# Patient Record
Sex: Male | Born: 1980 | Hispanic: No | Marital: Single | Smoking: Never smoker
Health system: Southern US, Community
[De-identification: ages and names within clinical notes are randomized; demographics above are authoritative.]

---
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
## 2013-07-07 ENCOUNTER — Emergency Department (HOSPITAL_COMMUNITY)
Admission: EM | Admit: 2013-07-07 | Discharge: 2013-07-07 | Disposition: A | Discharge status: Home / Self Care | Payer: Self-pay | Attending: Emergency Medicine | Admitting: Emergency Medicine

## 2013-07-07 ENCOUNTER — Emergency Department (HOSPITAL_COMMUNITY): Payer: Self-pay

## 2013-07-07 DIAGNOSIS — J029 Acute pharyngitis, unspecified: Secondary | ICD-10-CM | POA: Insufficient documentation

## 2013-07-07 LAB — RAPID STREP SCREEN (MED CTR MEBANE ONLY): Streptococcus, Group A Screen (Direct): NEGATIVE

## 2013-07-07 NOTE — ED Provider Notes (Signed)
CSN: [id number]     Arrival date & time 07/07/13  2147 History     First MD Initiated Contact with Patient 07/07/13 2248     Chief Complaint  Patient presents with  . Spitting up blood    (Consider location/radiation/quality/duration/timing/severity/associated sxs/prior Treatment) HPI Comments: Patient reports he has had a sore throat for a few days, thought he had a summer cold.  He has been clearing his throat a lot and spits a lot at baseline due to smoking.  States this morning he cleared his throat and spit out blood.  States he can spit without clearing his throat and see some blood as well.  Denies fevers, chills, body aches, nasal congestion, postnasal drip, cough, abdominal pain, N/V.  Denies hemetemesis, denies that he is coughing up blood.  States this blood appears to be coming from his throat.  Denies bleeding from anywhere else or any abnormal bruising.   The history is provided by the patient.    No past medical history on file. No past surgical history on file. No family history on file. History  Substance Use Topics  . Smoking status: Not on file  . Smokeless tobacco: Not on file  . Alcohol Use: Not on file    Review of Systems  Constitutional: Negative for fever.  HENT: Positive for sore throat. Negative for nosebleeds, congestion, trouble swallowing, dental problem, voice change and sinus pressure.   Respiratory: Negative for cough and shortness of breath.   Cardiovascular: Negative for chest pain.  Gastrointestinal: Negative for nausea, vomiting, abdominal pain, diarrhea and blood in stool.  Genitourinary: Negative for hematuria.  Hematological: Does not bruise/bleed easily.    Allergies  Review of patient's allergies indicates no known allergies.  Home Medications  No current outpatient prescriptions on file. BP 130/54  Pulse 68  Temp(Src) 98.9 F (37.2 C) (Oral)  Resp 16  SpO2 98% Physical Exam  Nursing note and vitals reviewed. Constitutional: He  appears well-developed and well-nourished. No distress.  HENT:  Head: Normocephalic and atraumatic.  Nose: Nose normal.  Mouth/Throat: Uvula is midline. Mucous membranes are not dry. No edematous. Posterior oropharyngeal erythema present. No oropharyngeal exudate, posterior oropharyngeal edema or tonsillar abscesses.  Neck: Trachea normal, normal range of motion and phonation normal. Neck supple. No tracheal tenderness present. No tracheal deviation present. No mass present.  Cardiovascular: Normal rate and regular rhythm.   Pulmonary/Chest: Effort normal and breath sounds normal. No stridor.  Lymphadenopathy:    He has cervical adenopathy.  Neurological: He is alert.  Skin: He is not diaphoretic.    ED Course   Procedures (including critical care time)  Labs Reviewed  RAPID STREP SCREEN  CULTURE, GROUP A STREP   Dg Neck Soft Tissue  07/07/2013   *RADIOLOGY REPORT*  Clinical Data: Hemoptysis, sore throat  NECK SOFT TISSUES - 1+ VIEW  Comparison: None.  Findings: Normal prevertebral soft tissues and epiglottic shadow. No radiopaque foreign body.  Mild cervical degenerative changes C4- 5 and C5-6.  Normal alignment.  IMPRESSION: No acute finding.   Original Report Authenticated By: Judie Petit. Miles Costain, M.D.    Filed Vitals:   07/07/13 2300  BP: 122/60  Pulse: 56  Temp:   Resp:     1. Pharyngitis     MDM  Pt with sore throat x several days and excessive throat clearing.  Pt is a smoker.  Pharynx is erythematous and pt has mild anterior cervical lymphadenopathy.  He is well appearing, afebrile, nontoxic.  Likely blood  from irritation of throat given likely viral infection + smoking.  Lungs CTAB, no cough.  No hemoptysis.  No abnormal bleeding or bruising.  Discussed all results with patient.  Pt given return precautions.  Pt verbalizes understanding and agrees with plan.     I doubt any other EMC precluding discharge at this time including, but not necessarily limited to the following:  PE,  pneumonia  Trixie Dredge, PA-C 07/08/13 0018

## 2013-07-07 NOTE — ED Notes (Signed)
Pt. C/o sore throat x3 days, lymph nodes present. Pt. States today he started coughing up blood. Denies SOB, chest pain, abdominal pain, N/V.

## 2013-07-07 NOTE — ED Notes (Signed)
PA EBW preformed strep test.

## 2013-07-07 NOTE — ED Notes (Signed)
Pt states he has been spitting up clots of blood since 0530. Has a sore throat. Denies cough, fever, chills, body aches

## 2013-07-09 NOTE — ED Provider Notes (Signed)
Medical screening examination/treatment/procedure(s) were performed by non-physician practitioner and as supervising physician I was immediately available for consultation/collaboration.    Lanaiya Lantry J. Adonia Porada, MD 07/09/13 0720 

## 2013-07-10 LAB — CULTURE, GROUP A STREP

## 2014-02-24 IMAGING — CR DG NECK SOFT TISSUE
1 series · 1 of 1 positions shown · non-contrast
Comparison: None.

CLINICAL DATA: Hemoptysis, sore throat

NECK SOFT TISSUES - 1+ VIEW

[w soft tissue neck]
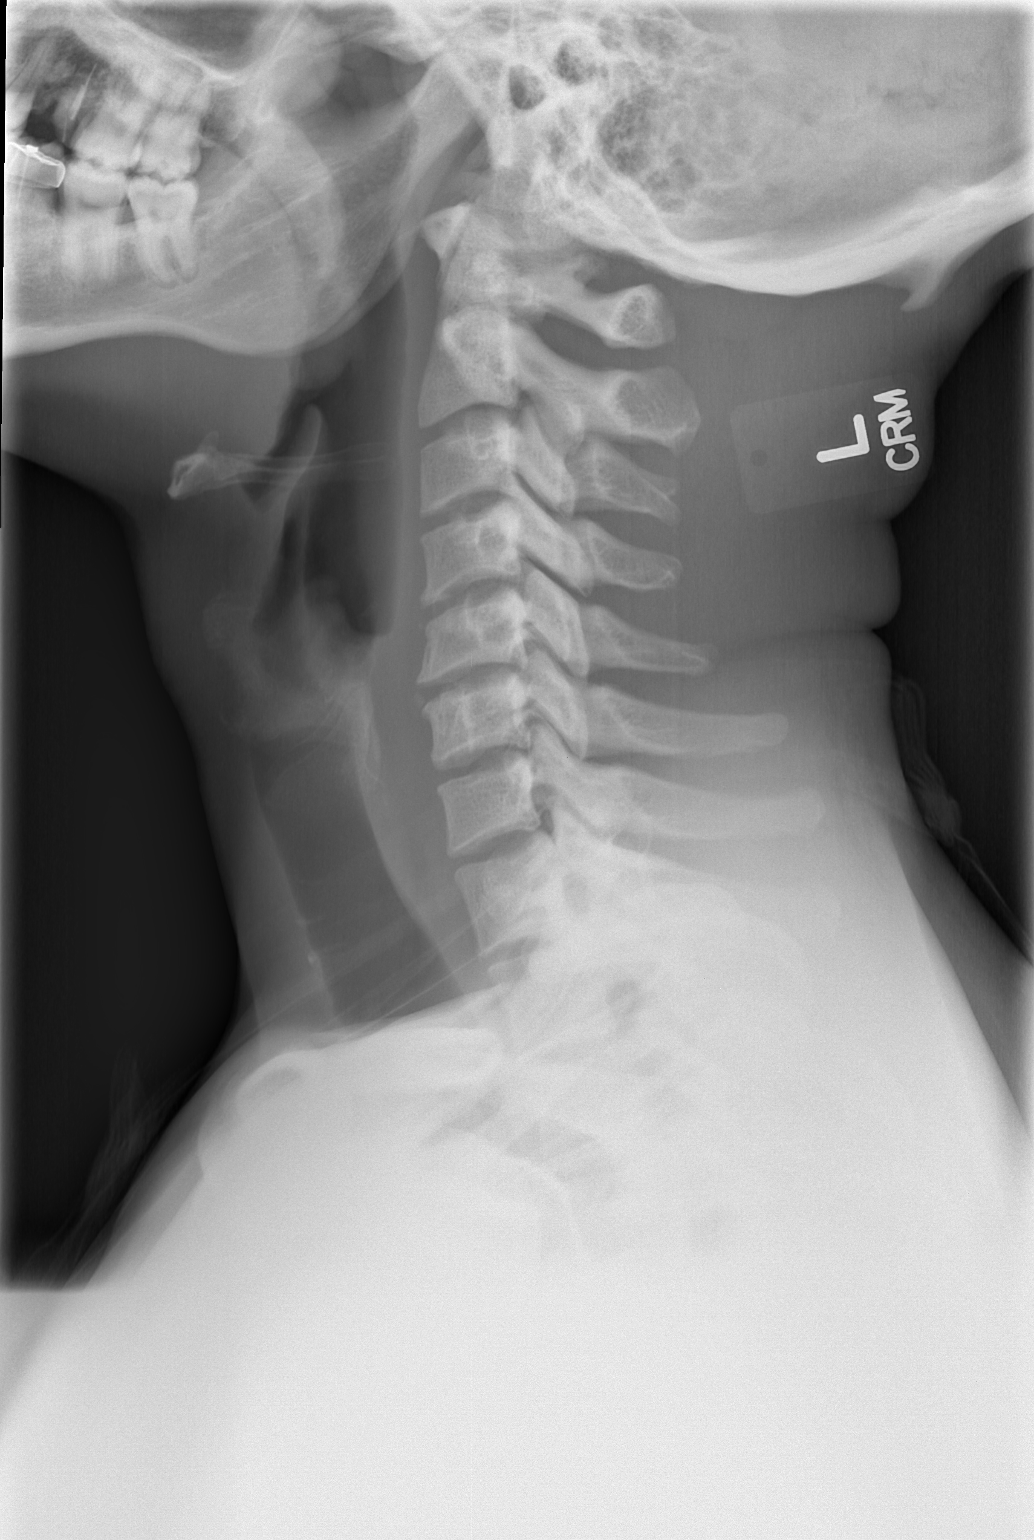

[1 of 1 positions shown; findings below may reference images not displayed]

FINDINGS: Normal prevertebral soft tissues and epiglottic shadow.
No radiopaque foreign body.  Mild cervical degenerative changes C4-
5 and C5-6.  Normal alignment.
IMPRESSION: No acute finding.

## 2016-01-27 ENCOUNTER — Emergency Department (INDEPENDENT_AMBULATORY_CARE_PROVIDER_SITE_OTHER)
Admission: EM | Admit: 2016-01-27 | Discharge: 2016-01-27 | Disposition: A | Discharge status: Home / Self Care | Payer: Self-pay | Source: Home / Self Care | Attending: Family Medicine | Admitting: Family Medicine

## 2016-01-27 ENCOUNTER — Encounter (HOSPITAL_COMMUNITY): Payer: Self-pay | Admitting: Emergency Medicine

## 2016-01-27 DIAGNOSIS — R51 Headache: Secondary | ICD-10-CM

## 2016-01-27 DIAGNOSIS — R519 Headache, unspecified: Secondary | ICD-10-CM

## 2016-01-27 LAB — POCT URINALYSIS DIP (DEVICE)
Glucose, UA: NEGATIVE mg/dL
LEUKOCYTES UA: NEGATIVE
Nitrite: NEGATIVE
Protein, ur: 30 mg/dL — AB
Urobilinogen, UA: 2 mg/dL — ABNORMAL HIGH (ref 0.0–1.0)
pH: 6 (ref 5.0–8.0)

## 2016-01-27 LAB — POCT I-STAT, CHEM 8
BUN: 12 mg/dL (ref 6–20)
CREATININE: 1.2 mg/dL (ref 0.61–1.24)
Calcium, Ion: 1.19 mmol/L (ref 1.12–1.23)
Chloride: 100 mmol/L — ABNORMAL LOW (ref 101–111)
GLUCOSE: 105 mg/dL — AB (ref 65–99)
HCT: 53 % — ABNORMAL HIGH (ref 39.0–52.0)
Hemoglobin: 18 g/dL — ABNORMAL HIGH (ref 13.0–17.0)
POTASSIUM: 4.2 mmol/L (ref 3.5–5.1)
Sodium: 142 mmol/L (ref 135–145)
TCO2: 30 mmol/L (ref 0–100)

## 2016-01-27 MED ORDER — NAPROXEN 500 MG PO TABS: 500.0000 mg | ORAL_TABLET | Freq: Two times a day (BID) | ORAL | Status: DC

## 2016-01-27 MED ORDER — KETOROLAC TROMETHAMINE 60 MG/2ML IM SOLN
60.0000 mg | Freq: Once | INTRAMUSCULAR | Status: AC
  Administered 2016-01-27: 60 mg via INTRAMUSCULAR

## 2016-01-27 MED ORDER — KETOROLAC TROMETHAMINE 60 MG/2ML IM SOLN
INTRAMUSCULAR | Status: AC
  Filled 2016-01-27: qty 2

## 2016-01-27 MED ORDER — ONDANSETRON HCL 4 MG PO TABS: 4.0000 mg | ORAL_TABLET | Freq: Four times a day (QID) | ORAL | Status: AC

## 2016-01-27 NOTE — ED Provider Notes (Signed)
CSN: [id number]     Arrival date & time 01/27/16  1314 History   First MD Initiated Contact with Patient 01/27/16 1424     Chief Complaint  Patient presents with  . Headache  . Generalized Body Aches   (Consider location/radiation/quality/duration/timing/severity/associated sxs/prior Treatment) HPI Headache and bilateral flank pain for 3 days. No improvement of either symptoms with OTC meds.  No dysuria, no headi njury. Pain score is 6.  Associated with some nausea, tried to drink [place] [employer] prior to arrival and vomited. No flu shot  flank pain started same day as headache History reviewed. No pertinent past medical history. History reviewed. No pertinent past surgical history. No family history on file. Social History  Substance Use Topics  . Smoking status: Current Every Day Smoker  . Smokeless tobacco: None  . Alcohol Use: Yes    Review of Systems Headache and flank pain Allergies  Review of patient's allergies indicates no known allergies.  Home Medications   Prior to Admission medications   Medication Sig Start Date End Date Taking? Authorizing Provider  Ascorbic Acid Buffered (BUFFERED C POWDER PO) Take by mouth.   Yes Historical Provider, MD  naproxen (NAPROSYN) 500 MG tablet Take 1 tablet (500 mg total) by mouth 2 (two) times daily. 01/27/16   Tharon Aquas, PA  ondansetron (ZOFRAN) 4 MG tablet Take 1 tablet (4 mg total) by mouth every 6 (six) hours. 01/27/16   Tharon Aquas, PA   Meds Ordered and Administered this Visit   Medications  ketorolac (TORADOL) injection 60 mg (60 mg Intramuscular Given 01/27/16 1514)    BP 128/64 mmHg  Pulse 61  Temp(Src) 99.1 F (37.3 C) (Oral)  Resp 16  SpO2 100% No data found.   Physical Exam NURSES NOTES AND VITAL SIGNS REVIEWED. CONSTITUTIONAL: Well developed, well nourished, no acute distress HEENT: normocephalic, atraumatic, right and left TM's are normal EYES: Conjunctiva normal NECK:normal ROM, supple, no  adenopathy PULMONARY:No respiratory distress, normal effort, Lungs: CTAb/l, no wheezes, or increased work of breathing CARDIOVASCULAR: RRR, no murmur ABDOMEN: soft, ND, NT, +'ve BS, without CVA -T MUSCULOSKELETAL: Normal ROM of all extremities,  SKIN: warm and dry without rash PSYCHIATRIC: Mood and affect, behavior are normal  ED Course  Procedures (including critical care time)  Labs Review Labs Reviewed  POCT URINALYSIS DIP (DEVICE) - Abnormal; Notable for the following:    Bilirubin Urine SMALL (*)    Ketones, ur TRACE (*)    Hgb urine dipstick SMALL (*)    Protein, ur 30 (*)    Urobilinogen, UA 2.0 (*)    All other components within normal limits  POCT I-STAT, CHEM 8 - Abnormal; Notable for the following:    Chloride 100 (*)    Glucose, Bld 105 (*)    Hemoglobin 18.0 (*)    HCT 53.0 (*)    All other components within normal limits    Imaging Review No results found.   Visual Acuity Review  Right Eye Distance:   Left Eye Distance:   Bilateral Distance:    Right Eye Near:   Left Eye Near:    Bilateral Near:        Reviewed bloods and UA with patient and his wife.  Toradol helped symptoms a great deal pain score 2 No indication for emergent transfer to ED.  MDM   1. Nonintractable headache, unspecified chronicity pattern, unspecified headache type    Patient is advised to continue home symptomatic treatment. Prescription for zofran and  naproxen sent pharmacy patient has indicated. Patient is advised that if there are new or worsening symptoms or attend the emergency department, or contact primary care provider. Instructions of care provided discharged home in stable condition. Return to work/school note provided.  THIS NOTE WAS GENERATED USING A VOICE RECOGNITION SOFTWARE PROGRAM. ALL REASONABLE EFFORTS  WERE MADE TO PROOFREAD THIS DOCUMENT FOR ACCURACY.     Tharon Aquas, PA 01/27/16 1738  Tharon Aquas, [place] 01/27/16 1739

## 2016-01-27 NOTE — Discharge Instructions (Signed)

## 2016-01-27 NOTE — ED Notes (Signed)
Patient reports onset of symptoms last Friday, 2/17.  Patient feels generalized pain, headache, particularly in the evening.  Eyes sore to touch, intermittent nausea, has vomited a few times, no diarrhea.  Patient denies cough, runny nose, sore throat.

## 2017-11-25 ENCOUNTER — Encounter (HOSPITAL_COMMUNITY): Payer: Self-pay

## 2017-11-25 ENCOUNTER — Emergency Department (HOSPITAL_COMMUNITY)
Admission: EM | Admit: 2017-11-25 | Discharge: 2017-11-25 | Disposition: A | Discharge status: Home / Self Care | Payer: Self-pay | Attending: Emergency Medicine | Admitting: Emergency Medicine

## 2017-11-25 DIAGNOSIS — K0889 Other specified disorders of teeth and supporting structures: Secondary | ICD-10-CM | POA: Insufficient documentation

## 2017-11-25 DIAGNOSIS — K029 Dental caries, unspecified: Secondary | ICD-10-CM | POA: Insufficient documentation

## 2017-11-25 DIAGNOSIS — F17200 Nicotine dependence, unspecified, uncomplicated: Secondary | ICD-10-CM | POA: Insufficient documentation

## 2017-11-25 DIAGNOSIS — Z79899 Other long term (current) drug therapy: Secondary | ICD-10-CM | POA: Insufficient documentation

## 2017-11-25 MED ORDER — PENICILLIN V POTASSIUM 500 MG PO TABS: 500.0000 mg | ORAL_TABLET | Freq: Four times a day (QID) | ORAL | 0 refills | Status: AC

## 2017-11-25 MED ORDER — IBUPROFEN 600 MG PO TABS: 600.0000 mg | ORAL_TABLET | Freq: Four times a day (QID) | ORAL | 0 refills | Status: AC | PRN

## 2017-11-25 MED ORDER — IBUPROFEN 400 MG PO TABS
600.0000 mg | ORAL_TABLET | Freq: Once | ORAL | Status: AC
  Administered 2017-11-25: 600 mg via ORAL
  Filled 2017-11-25: qty 1

## 2017-11-25 MED ORDER — PENICILLIN V POTASSIUM 250 MG PO TABS
500.0000 mg | ORAL_TABLET | Freq: Once | ORAL | Status: AC
  Administered 2017-11-25: 500 mg via ORAL
  Filled 2017-11-25: qty 2

## 2017-11-25 NOTE — ED Triage Notes (Signed)
Pt states that for the past week he has had pain to the upper L side. Some swelling noted.

## 2017-11-25 NOTE — ED Provider Notes (Signed)
[facility]   CSN: [id number] Arrival date & time: 11/25/17  2007     History   Chief Complaint Chief Complaint  Patient presents with  . Dental Pain    HPI Jonathan Mccoy is a 36 y.o. male.  HPI  36 y.o. male, presents to the Emergency Department today due to left upper dental pain x 1 week. Notes mild swelling. No redness. States pain with chewing. Rates pain 5/10. Throbbing sensation. NO hx same. Notes cavities. Has not seen dentist. No fevers. No meds PTA. No N/V. Able to tolerate PO. No other symptoms noted.    History reviewed. No pertinent past medical history.  There are no active problems to display for this patient.   History reviewed. No pertinent surgical history.     Home Medications    Prior to Admission medications   Medication Sig Start Date End Date Taking? Authorizing Provider  Ascorbic Acid Buffered (BUFFERED C POWDER PO) Take by mouth.    [provider]  naproxen (NAPROSYN) 500 MG tablet Take 1 tablet (500 mg total) by mouth 2 (two) times daily. 01/27/16   Tharon AquasPatrick, Frank C, PA  ondansetron (ZOFRAN) 4 MG tablet Take 1 tablet (4 mg total) by mouth every 6 (six) hours. 01/27/16   Tharon AquasPatrick, Frank C, PA    Family History No family history on file.  Social History Social History   Tobacco Use  . Smoking status: Current Every Day Smoker  . Smokeless tobacco: Never Used  Substance Use Topics  . Alcohol use: Yes  . Drug use: Yes    Types: Marijuana     Allergies   Patient has no known allergies.   Review of Systems Review of Systems ROS reviewed and all are negative for acute change except as noted in the HPI.  Physical Exam Updated Vital Signs BP (!) 159/82   Pulse 62   Temp 98.6 F (37 C)   Resp 20   Ht 5\' 8"  (1.727 m)   Wt 81.2 kg (179 lb)   SpO2 99%   BMI 27.22 kg/m   Physical Exam  Constitutional: He is oriented to person, place, and time. He appears  well-developed and well-nourished. No distress.  HENT:  Head: Normocephalic and atraumatic.  Right Ear: Tympanic membrane, external ear and ear canal normal.  Left Ear: Tympanic membrane, external ear and ear canal normal.  Nose: Nose normal.  Mouth/Throat: Uvula is midline, oropharynx is clear and moist and mucous membranes are normal. No trismus in the jaw. No oropharyngeal exudate, posterior oropharyngeal erythema or tonsillar abscesses.  Left upper gingiva without evidence of dental abscess. Noted poor dentition with caries. Posterior oropharynx without edema or signs of infection. No ludwigs. Tolerating secretions.   Eyes: EOM are normal. Pupils are equal, round, and reactive to light.  Neck: Normal range of motion. Neck supple. No tracheal deviation present.  Cardiovascular: Normal rate, regular rhythm, S1 normal, S2 normal, normal heart sounds, intact distal pulses and normal pulses.  Pulmonary/Chest: Effort normal and breath sounds normal. No respiratory distress. He has no decreased breath sounds. He has no wheezes. He has no rhonchi. He has no rales.  Abdominal: Normal appearance and bowel sounds are normal. There is no tenderness.  Musculoskeletal: Normal range of motion.  Neurological: He is alert and oriented to person, place, and time.  Skin: Skin is warm and dry.  Psychiatric: He has a normal mood and affect. His speech is normal and behavior  is normal. Thought content normal.     ED Treatments / Results  Labs (all labs ordered are listed, but only abnormal results are displayed) Labs Reviewed - No data to display  EKG  EKG Interpretation None       Radiology No results found.  Procedures Procedures (including critical care time)  Medications Ordered in ED Medications - No data to display   Initial Impression / Assessment and Plan / ED Course  I have reviewed the triage vital signs and the nursing notes.  Pertinent labs & imaging results that were available  during my care of the patient were reviewed by me and considered in my medical decision making (see chart for details).  Final Clinical Impressions(s) / ED Diagnoses     {I have reviewed the relevant previous healthcare records.  {I obtained HPI from historian.   ED Course:  Assessment: Dental pain associated with dental cary but no signs or symptoms of dental abscess with patient afebrile, non toxic appearing and swallowing secretions well. Exam unconcerning for Ludwig's angina or other deep tissue infection in neck. As there is no facial swelling or gum findings. Will treat with pain medication. Given Rx ABX.   I gave patient referral to dentist and stressed the importance of dental follow up for ultimate management of dental pain. Patient voices understanding and is agreeable to plan  Disposition/Plan:  DC Home Additional Verbal discharge instructions given and discussed with patient.  Pt Instructed to f/u with Dentist in the next week for evaluation and treatment of symptoms. Return precautions given Pt acknowledges and agrees with plan  Supervising Physician Tegeler, Canary Brimhristopher J, *  Final diagnoses:  Pain, dental  Dental caries    ED Discharge Orders    None       Audry PiliMohr, Lorely Bubb, Cordelia Poche-C 11/25/17 2159    Tegeler, Canary Brimhristopher J, MD 11/26/17 [phone]

## 2017-11-25 NOTE — Discharge Instructions (Signed)
Please read and follow all provided instructions.  Your diagnoses today include:  1. Pain, dental   2. Dental caries     Tests performed today include: Vital signs. See below for your results today.   Medications prescribed:  Take as prescribed   Home care instructions:  Follow any educational materials contained in this packet.  Follow-up instructions: Please follow-up with a Dentist for further evaluation of symptoms and treatment   Return instructions:  Please return to the Emergency Department if you do not get better, if you get worse, or new symptoms OR  - Fever (temperature greater than 101.65F)  - Bleeding that does not stop with holding pressure to the area    -Severe pain (please note that you may be more sore the day after your accident)  - Chest Pain  - Difficulty breathing  - Severe nausea or vomiting  - Inability to tolerate food and liquids  - Passing out  - Skin becoming red around your wounds  - Change in mental status (confusion or lethargy)  - New numbness or weakness    Please return if you have any other emergent concerns.  Additional Information:  Your vital signs today were: BP (!) 159/82    Pulse 62    Temp 98.6 F (37 C)    Resp 20    Ht 5\' 8"  (1.727 m)    Wt 81.2 kg (179 lb)    SpO2 99%    BMI 27.22 kg/m  If your blood pressure (BP) was elevated above 135/85 this visit, please have this repeated by your doctor within one month. ---------------

## 2023-04-11 ENCOUNTER — Encounter: Payer: Self-pay | Admitting: Nurse Practitioner

## 2023-04-11 ENCOUNTER — Ambulatory Visit: Payer: Medicaid Other | Admitting: Nurse Practitioner

## 2023-04-11 VITALS — BP 130/72 | HR 56 | Ht 69.0 in | Wt 236.8 lb

## 2023-04-11 DIAGNOSIS — M67432 Ganglion, left wrist: Secondary | ICD-10-CM | POA: Diagnosis not present

## 2023-04-11 DIAGNOSIS — R072 Precordial pain: Secondary | ICD-10-CM

## 2023-04-11 DIAGNOSIS — G8929 Other chronic pain: Secondary | ICD-10-CM | POA: Diagnosis not present

## 2023-04-11 DIAGNOSIS — W57XXXA Bitten or stung by nonvenomous insect and other nonvenomous arthropods, initial encounter: Secondary | ICD-10-CM | POA: Diagnosis not present

## 2023-04-11 DIAGNOSIS — M238X2 Other internal derangements of left knee: Secondary | ICD-10-CM | POA: Diagnosis not present

## 2023-04-11 DIAGNOSIS — Z13228 Encounter for screening for other metabolic disorders: Secondary | ICD-10-CM

## 2023-04-11 DIAGNOSIS — Z1321 Encounter for screening for nutritional disorder: Secondary | ICD-10-CM | POA: Diagnosis not present

## 2023-04-11 DIAGNOSIS — Z1329 Encounter for screening for other suspected endocrine disorder: Secondary | ICD-10-CM | POA: Diagnosis not present

## 2023-04-11 DIAGNOSIS — S30861A Insect bite (nonvenomous) of abdominal wall, initial encounter: Secondary | ICD-10-CM | POA: Diagnosis not present

## 2023-04-11 DIAGNOSIS — Z0000 Encounter for general adult medical examination without abnormal findings: Secondary | ICD-10-CM

## 2023-04-11 DIAGNOSIS — M25511 Pain in right shoulder: Secondary | ICD-10-CM | POA: Diagnosis not present

## 2023-04-11 DIAGNOSIS — R002 Palpitations: Secondary | ICD-10-CM | POA: Diagnosis not present

## 2023-04-11 DIAGNOSIS — M25562 Pain in left knee: Secondary | ICD-10-CM

## 2023-04-11 DIAGNOSIS — Z130 Encounter for screening for diseases of the blood and blood-forming organs and certain disorders involving the immune mechanism: Secondary | ICD-10-CM | POA: Diagnosis not present

## 2023-04-11 LAB — COMPREHENSIVE METABOLIC PANEL

## 2023-04-11 LAB — HEMOGLOBIN A1C
Est. average glucose Bld gHb Est-mCnc: 126 mg/dL
Hgb A1c MFr Bld: 6 % — ABNORMAL HIGH (ref 4.8–5.6)

## 2023-04-11 LAB — LIPID PANEL

## 2023-04-11 LAB — CBC WITH DIFFERENTIAL/PLATELET
EOS (ABSOLUTE): 0.2 10*3/uL (ref 0.0–0.4)
Eos: 2 %
Immature Grans (Abs): 0 10*3/uL (ref 0.0–0.1)
Immature Granulocytes: 0 %
Lymphocytes Absolute: 2.7 10*3/uL (ref 0.7–3.1)
MCH: 23.7 pg — ABNORMAL LOW (ref 26.6–33.0)
MCHC: 30.3 g/dL — ABNORMAL LOW (ref 31.5–35.7)
Monocytes Absolute: 0.7 10*3/uL (ref 0.1–0.9)
Monocytes: 10 %
Neutrophils: 48 %
RBC: 5.87 x10E6/uL — ABNORMAL HIGH (ref 4.14–5.80)
WBC: 6.8 10*3/uL (ref 3.4–10.8)

## 2023-04-11 LAB — IRON,TIBC AND FERRITIN PANEL

## 2023-04-11 MED ORDER — MELOXICAM 15 MG PO TABS: 15.0000 mg | ORAL_TABLET | Freq: Every day | ORAL | 2 refills | Status: AC
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
## 2023-04-11 NOTE — Patient Instructions (Addendum)
I have sent in the referral for Orthopedic evaluation with Dr. Steward Drone. He is great.  I have also sent in Meloxicam to help with your pain. You can use this on the days your shoulder and/or knee are bothering you. Do not take this with ibuprofen, advil, or aleve, as they are similar medications and can cause interaction.   I will let you know what your labs show and if we need to make any changes, we will discuss the options.   I will also review the EKG and let you know if there are any concerns.   I have included information on the back of this about the Knee, Shoulder, Cyst, and the EKG test.   If you have any concerns or questions, please don't hesitate to reach out.     For all adult patients, I recommend A well balanced diet low in saturated fats, cholesterol, and moderation in carbohydrates.   This can be as simple as monitoring portion sizes and cutting back on sugary beverages such as soda and juice to start with.    Daily water consumption of at least 64 ounces.  Physical activity at least 180 minutes per week, if just starting out.   This can be as simple as taking the stairs instead of the elevator and walking 2-3 laps around the office  purposefully every day.   STD protection, partner selection, and regular testing if high risk.  Limited consumption of alcoholic beverages if alcohol is consumed.  For women, I recommend no more than 7 alcoholic beverages per week, spread out throughout the week.  Avoid "binge" drinking or consuming large quantities of alcohol in one setting.   Please let me know if you feel you may need help with reduction or quitting alcohol consumption.   Avoidance of nicotine, if used.  Please let me know if you feel you may need help with reduction or quitting nicotine use.   Daily mental health attention.  This can be in the form of 5 minute daily meditation, prayer, journaling, yoga, reflection, etc.   Purposeful attention to your emotions and  mental state can significantly improve your overall wellbeing  and  Health.  Please know that I am here to help you with all of your health care goals and am happy to work with you to find a solution that works best for you.  The greatest advice I have received with any changes in life are to take it one step at a time, that even means if all you can focus on is the next 60 seconds, then do that and celebrate your victories.  With any changes in life, you will have set backs, and that is OK. The important thing to remember is, if you have a set back, it is not a failure, it is an opportunity to try again!  Health Maintenance Recommendations Screening Testing Mammogram Every 1 -2 years based on history and risk factors Starting at age 15 Pap Smear Ages 21-39 every 3 years Ages 34-65 every 5 years with HPV testing More frequent testing may be required based on results and history Colon Cancer Screening Every 1-10 years based on test performed, risk factors, and history Starting at age 76 Bone Density Screening Every 2-10 years based on history Starting at age 102 for women Recommendations for men differ based on medication usage, history, and risk factors AAA Screening One time ultrasound Men 1-42 years old who have every smoked Lung Cancer Screening Low Dose Lung CT  every 12 months Age 15-80 years with a 30 pack-year smoking history who still smoke or who have quit within the last 15 years  Screening Labs Routine  Labs: Complete Blood Count (CBC), Complete Metabolic Panel (CMP), Cholesterol (Lipid Panel) Every 6-12 months based on history and medications May be recommended more frequently based on current conditions or previous results Hemoglobin A1c Lab Every 3-12 months based on history and previous results Starting at age 73 or earlier with diagnosis of diabetes, high cholesterol, BMI >26, and/or risk factors Frequent monitoring for patients with diabetes to ensure blood sugar  control Thyroid Panel (TSH w/ T3 & T4) Every 6 months based on history, symptoms, and risk factors May be repeated more often if on medication HIV One time testing for all patients 40 and older May be repeated more frequently for patients with increased risk factors or exposure Hepatitis C One time testing for all patients 36 and older May be repeated more frequently for patients with increased risk factors or exposure Gonorrhea, Chlamydia Every 12 months for all sexually active persons 13-24 years Additional monitoring may be recommended for those who are considered high risk or who have symptoms PSA Men 56-52 years old with risk factors Additional screening may be recommended from age 75-69 based on risk factors, symptoms, and history  Vaccine Recommendations Tetanus Booster All adults every 10 years Flu Vaccine All patients 6 months and older every year COVID Vaccine All patients 12 years and older Initial dosing with booster May recommend additional booster based on age and health history HPV Vaccine 2 doses all patients age 37-26 Dosing may be considered for patients over 26 Shingles Vaccine (Shingrix) 2 doses all adults 55 years and older Pneumonia (Pneumovax 23) All adults 65 years and older May recommend earlier dosing based on health history Pneumonia (Prevnar 42) All adults 65 years and older Dosed 1 year after Pneumovax 23  Additional Screening, Testing, and Vaccinations may be recommended on an individualized basis based on family history, health history, risk factors, and/or exposure.

## 2023-04-12 LAB — TSH: TSH: 1.09 u[IU]/mL (ref 0.450–4.500)

## 2023-04-12 LAB — CBC WITH DIFFERENTIAL/PLATELET
Basophils Absolute: 0 10*3/uL (ref 0.0–0.2)
Basos: 0 %
Hematocrit: 45.8 % (ref 37.5–51.0)
Hemoglobin: 13.9 g/dL (ref 13.0–17.7)
Lymphs: 40 %
MCV: 78 fL — ABNORMAL LOW (ref 79–97)
Neutrophils Absolute: 3.2 10*3/uL (ref 1.4–7.0)
Platelets: 237 10*3/uL (ref 150–450)
RDW: 16.1 % — ABNORMAL HIGH (ref 11.6–15.4)

## 2023-04-12 LAB — COMPREHENSIVE METABOLIC PANEL
ALT: 30 IU/L (ref 0–44)
Alkaline Phosphatase: 86 IU/L (ref 44–121)
BUN/Creatinine Ratio: 8 — ABNORMAL LOW (ref 9–20)
BUN: 11 mg/dL (ref 6–24)
CO2: 22 mmol/L (ref 20–29)
Calcium: 9.2 mg/dL (ref 8.7–10.2)
Globulin, Total: 3 g/dL (ref 1.5–4.5)
Glucose: 104 mg/dL — ABNORMAL HIGH (ref 70–99)
Potassium: 4.4 mmol/L (ref 3.5–5.2)
Sodium: 146 mmol/L — ABNORMAL HIGH (ref 134–144)
Total Protein: 7.2 g/dL (ref 6.0–8.5)
eGFR: 71 mL/min/{1.73_m2} (ref >59)

## 2023-04-12 LAB — LIPID PANEL: VLDL Cholesterol Cal: 24 mg/dL (ref 5–40)

## 2023-04-12 LAB — IRON,TIBC AND FERRITIN PANEL
Iron: 74 ug/dL (ref 38–169)
UIBC: 268 ug/dL (ref 111–343)

## 2023-04-26 ENCOUNTER — Ambulatory Visit (HOSPITAL_BASED_OUTPATIENT_CLINIC_OR_DEPARTMENT_OTHER): Payer: Medicaid Other | Admitting: Orthopaedic Surgery

## 2023-04-26 ENCOUNTER — Ambulatory Visit (INDEPENDENT_AMBULATORY_CARE_PROVIDER_SITE_OTHER): Payer: Medicaid Other

## 2023-04-26 DIAGNOSIS — M25511 Pain in right shoulder: Secondary | ICD-10-CM

## 2023-04-26 DIAGNOSIS — M25562 Pain in left knee: Secondary | ICD-10-CM

## 2023-04-26 DIAGNOSIS — G8929 Other chronic pain: Secondary | ICD-10-CM

## 2023-04-26 DIAGNOSIS — M1712 Unilateral primary osteoarthritis, left knee: Secondary | ICD-10-CM | POA: Diagnosis not present
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
## 2023-04-28 DIAGNOSIS — G8929 Other chronic pain: Secondary | ICD-10-CM | POA: Insufficient documentation

## 2023-04-28 DIAGNOSIS — Z0000 Encounter for general adult medical examination without abnormal findings: Secondary | ICD-10-CM | POA: Insufficient documentation

## 2023-04-28 DIAGNOSIS — R072 Precordial pain: Secondary | ICD-10-CM | POA: Insufficient documentation

## 2023-04-28 DIAGNOSIS — R002 Palpitations: Secondary | ICD-10-CM | POA: Insufficient documentation

## 2023-04-28 DIAGNOSIS — M67432 Ganglion, left wrist: Secondary | ICD-10-CM | POA: Insufficient documentation

## 2023-04-28 DIAGNOSIS — S30861A Insect bite (nonvenomous) of abdominal wall, initial encounter: Secondary | ICD-10-CM | POA: Insufficient documentation

## 2023-04-28 DIAGNOSIS — M238X2 Other internal derangements of left knee: Secondary | ICD-10-CM | POA: Insufficient documentation

## 2023-04-28 NOTE — Assessment & Plan Note (Signed)
Intermittent precordial pain of unknown etiology. No alarm symptoms are present. Does not appear to be exertional in nature. EKG today shows 1st degree block with no other findings. Consider possible GERD, costochondritis, or anxiety as trigger.  Plan: - Continue to monitor for symptoms and keep a log of what was eaten, activity, and any other associated symptoms that occur around the time of the pain.  - Consider use of famotidine daily to see if pain is relieved.  - Labs pending

## 2023-04-28 NOTE — Assessment & Plan Note (Signed)
Chronic right knee pain with history of ACL tear and surgical repair 5 years ago. Symptoms have been worsening over the past few months, with increased swelling and decreased range of motion. Plan: - Refer to orthopedics (Dr. Steward Drone) for evaluation and management - Prescribe meloxicam for pain relief and inflammation control as needed - Encourage physical activity and weight management to reduce stress on the knee joint

## 2023-04-28 NOTE — Assessment & Plan Note (Signed)
CPE completed today.   Labs ordered. Will make changes as necessary based on results.  Review of HM activities and recommendations discussed and provided on AVS Anticipatory guidance, diet, and exercise recommendations provided.  Medications, allergies, and hx reviewed and updated as necessary.  Plan to f/u with CPE in 1 year or sooner for acute/chronic health needs as directed.   

## 2023-04-28 NOTE — Assessment & Plan Note (Signed)
Patient reports intermittent episodes of chest tightness and palpitations over the past few months. No known cardiac history. Symptoms are not exertional and seem to occur at random times. Plan: - EKG shows 1st degree HB, but otherwise unremarkable.  - Check thyroid function tests as part of the lab workup - Consider long-term cardiac monitoring if concerns continue - Encourage the patient to maintain a symptom journal to identify potential triggers

## 2023-04-28 NOTE — Assessment & Plan Note (Signed)
Recent tick bite noted on the right abdominal wall. Area is clean with no signs of infection or rash at this time. Plan: - Monitor the healing process and report any changes or concerns - No further intervention needed at this time

## 2023-04-28 NOTE — Assessment & Plan Note (Signed)
Reports intermittent "popping" sensation in the left shoulder, with occasional pain and decreased range of motion. History of loud pop and pain with weight training.  Plan: - Refer to orthopedics (Dr. Steward Drone) for evaluation and management - Monitor for any changes in symptoms or function

## 2023-04-28 NOTE — Assessment & Plan Note (Signed)
Small ganglion cyst present on the dorsal aspect of the right wrist. No pain or functional limitations reported. Plan: - Reassure the patient that it is benign and typically does not require treatment unless causing discomfort - Monitor for any changes in size or symptoms

## 2023-05-24 ENCOUNTER — Ambulatory Visit (HOSPITAL_BASED_OUTPATIENT_CLINIC_OR_DEPARTMENT_OTHER): Payer: Medicaid Other | Admitting: Orthopaedic Surgery

## 2023-06-01 ENCOUNTER — Ambulatory Visit (HOSPITAL_COMMUNITY)
Admission: EM | Admit: 2023-06-01 | Discharge: 2023-06-01 | Disposition: A | Discharge status: Home / Self Care | Payer: Medicaid Other | Attending: Family Medicine | Admitting: Family Medicine

## 2023-06-01 ENCOUNTER — Encounter (HOSPITAL_COMMUNITY): Payer: Self-pay

## 2023-06-01 ENCOUNTER — Ambulatory Visit (INDEPENDENT_AMBULATORY_CARE_PROVIDER_SITE_OTHER): Payer: Medicaid Other

## 2023-06-01 DIAGNOSIS — Z203 Contact with and (suspected) exposure to rabies: Secondary | ICD-10-CM | POA: Diagnosis not present

## 2023-06-01 DIAGNOSIS — M7989 Other specified soft tissue disorders: Secondary | ICD-10-CM | POA: Diagnosis not present

## 2023-06-01 DIAGNOSIS — R2242 Localized swelling, mass and lump, left lower limb: Secondary | ICD-10-CM

## 2023-06-01 DIAGNOSIS — M79672 Pain in left foot: Secondary | ICD-10-CM | POA: Diagnosis not present

## 2023-06-01 DIAGNOSIS — S91312S Laceration without foreign body, left foot, sequela: Secondary | ICD-10-CM

## 2023-06-01 MED ORDER — TETANUS-DIPHTH-ACELL PERTUSSIS 5-2.5-18.5 LF-MCG/0.5 IM SUSY
0.5000 mL | PREFILLED_SYRINGE | Freq: Once | INTRAMUSCULAR | Status: AC
  Administered 2023-06-01: 0.5 mL via INTRAMUSCULAR

## 2023-06-01 MED ORDER — TETANUS-DIPHTH-ACELL PERTUSSIS 5-2.5-18.5 LF-MCG/0.5 IM SUSY
PREFILLED_SYRINGE | INTRAMUSCULAR | Status: AC
  Filled 2023-06-01: qty 0.5

## 2023-06-01 MED ORDER — PREDNISONE 20 MG PO TABS: 20.0000 mg | ORAL_TABLET | Freq: Every day | ORAL | 0 refills | Status: AC

## 2023-06-01 MED ORDER — DOXYCYCLINE HYCLATE 100 MG PO CAPS: 100.0000 mg | ORAL_CAPSULE | Freq: Two times a day (BID) | ORAL | 0 refills | Status: AC

## 2023-06-01 NOTE — ED Triage Notes (Signed)
Pt states cut his lt foot with glass over 2wks ago. States now having swelling and drainage from the area x1wk.
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
## 2023-06-01 NOTE — Discharge Instructions (Addendum)
X-ray of your foot did not show any foreign bodies nor did it show any evidence of any evolving bone infection. However given tenderness and swelling there is some concern for a tissue infection so we will cover you with an antibiotic and also placed you on a low-dose of a steroid to reduce the swelling and inflammation in the foot.  Your symptoms are not improving within the next 5 days I would like for you to follow-up at [facility] as she may require more advanced imaging .

## 2023-06-13 ENCOUNTER — Encounter (HOSPITAL_BASED_OUTPATIENT_CLINIC_OR_DEPARTMENT_OTHER): Payer: Self-pay | Admitting: Orthopaedic Surgery

## 2023-06-14 ENCOUNTER — Other Ambulatory Visit: Payer: Medicaid Other

## 2023-06-19 ENCOUNTER — Other Ambulatory Visit (HOSPITAL_BASED_OUTPATIENT_CLINIC_OR_DEPARTMENT_OTHER): Payer: Self-pay | Admitting: Orthopaedic Surgery

## 2023-06-19 DIAGNOSIS — G8929 Other chronic pain: Secondary | ICD-10-CM

## 2023-06-21 ENCOUNTER — Ambulatory Visit (HOSPITAL_BASED_OUTPATIENT_CLINIC_OR_DEPARTMENT_OTHER): Payer: Medicaid Other | Admitting: Orthopaedic Surgery

## 2023-06-22 DIAGNOSIS — S9032XA Contusion of left foot, initial encounter: Secondary | ICD-10-CM | POA: Diagnosis not present

## 2023-06-30 ENCOUNTER — Ambulatory Visit (HOSPITAL_BASED_OUTPATIENT_CLINIC_OR_DEPARTMENT_OTHER): Payer: Medicaid Other | Admitting: Orthopaedic Surgery

## 2023-07-14 ENCOUNTER — Encounter (HOSPITAL_BASED_OUTPATIENT_CLINIC_OR_DEPARTMENT_OTHER): Payer: Self-pay | Admitting: Physical Therapy

## 2023-07-14 ENCOUNTER — Ambulatory Visit (HOSPITAL_BASED_OUTPATIENT_CLINIC_OR_DEPARTMENT_OTHER): Payer: Medicaid Other | Attending: Nurse Practitioner | Admitting: Physical Therapy

## 2023-07-14 ENCOUNTER — Other Ambulatory Visit: Payer: Self-pay

## 2023-07-14 DIAGNOSIS — M25511 Pain in right shoulder: Secondary | ICD-10-CM | POA: Diagnosis not present

## 2023-07-14 DIAGNOSIS — G8929 Other chronic pain: Secondary | ICD-10-CM | POA: Insufficient documentation

## 2023-07-14 NOTE — Therapy (Signed)
OUTPATIENT PHYSICAL THERAPY UPPER EXTREMITY EVALUATION   Patient Name: Jonathan Mccoy MRN: [id number] DOB:02-18-1981, 42 y.o., male Today's Date: 07/14/2023  END OF SESSION:  PT End of Session - 07/14/23 1137     Visit Number 1    Number of Visits 8    Date for PT Re-Evaluation 09/08/23    PT Start Time 0935    PT Stop Time 1013    PT Time Calculation (min) 38 min    Activity Tolerance Patient tolerated treatment well    Behavior During Therapy [facility] for tasks assessed/performed             History reviewed. No pertinent past medical history. History reviewed. No pertinent surgical history. Patient Active Problem List   Diagnosis Date Noted   Encounter for annual physical exam 04/28/2023   Ganglion cyst of wrist, left 04/28/2023   Chronic right shoulder pain 04/28/2023   Crepitus of joint of left knee 04/28/2023   Chronic pain of left knee 04/28/2023   Tick bite of abdominal wall 04/28/2023   Precordial pain 04/28/2023   Intermittent palpitations 04/28/2023    PCP: Di Kindle Early NP  REFERRING PROVIDER: Huel Cote   REFERRING DIAG:  Diagnosis  M25.511,G89.29 (ICD-10-CM) - Chronic right shoulder pain    THERAPY DIAG:  Chronic right shoulder pain  Rationale for Evaluation and Treatment: Rehabilitation  ONSET DATE: Several years ago  SUBJECTIVE:                                                                                                                                                                                      SUBJECTIVE STATEMENT: Patient has long history of right posterior shoulder pain.  He remembers several years ago bench pressing and feeling a pop.  Since that point he has pain with end range of motion in flexion and reaching behind his head back.  He also at times has pain in the front of the shoulder.  He has a history of left knee pain as well.  He is pending an MRI of both his shoulder and his knee but has been delayed by insurance.  We  will hold on his knee unless cleared by MD but will work on strengthening his shoulder at this time. Hand dominance: Right  PERTINENT HISTORY: Chronic pain of left knee  PAIN:  Are you having pain? Yes: NPRS scale: 5 out of 10/10 Pain location: Right shoulder Pain description: Aching Aggravating factors: Endrange movements Relieving factors: Not moving into those ranges  PRECAUTIONS: None  RED FLAGS: None   WEIGHT BEARING RESTRICTIONS: No  FALLS:  Has patient fallen in last 6 months? No  LIVING  ENVIRONMENT: Nothing pertinent OCCUPATION: Helps out with roofing at times    Hobbies:  Get back into the gym  PLOF: Independent  PATIENT GOALS: to have less pain    NEXT MD VISIT:   OBJECTIVE:   DIAGNOSTIC FINDINGS:  Waiting on MRI's   PATIENT SURVEYS :  FOTO    COGNITION: Overall cognitive status: Within functional limits for tasks assessed     SENSATION: WFL  POSTURE: Rounded shoulders forward head  UPPER EXTREMITY ROM:   Active ROM Right eval Left eval  Shoulder flexion 160 degrees with pain Full  Shoulder extension    Shoulder abduction    Shoulder adduction    Shoulder internal rotation Pulling in the front of the shoulder but equal motion to left side   Shoulder external rotation .  Pain when reaching behind head   Elbow flexion    Elbow extension    Wrist flexion    Wrist extension    Wrist ulnar deviation    Wrist radial deviation    Wrist pronation    Wrist supination    (Blank rows = not tested) Full passive right shoulder range of motion UPPER EXTREMITY MMT:  MMT Right eval Left eval  Shoulder flexion 4 5  Shoulder extension    Shoulder abduction    Shoulder adduction    Shoulder internal rotation 4+ 5  Shoulder external rotation 3+ 5  Middle trapezius    Lower trapezius    Elbow flexion    Elbow extension    Wrist flexion    Wrist extension    Wrist ulnar deviation    Wrist radial deviation    Wrist pronation    Wrist  supination    Grip strength (lbs)    (Blank rows = not tested)     PALPATION:  Tenderness to palpation in upper trap and posterior shoulder around subscap area   TODAY'S TREATMENT:                                                                                                                                         DATE: Access Code: [email or web address] URL: [email or web address] Date: 07/14/2023 Prepared by: Lorayne Bender  Exercises - Theracane Over Shoulder  - 1 x daily - 7 x weekly - 3 sets - 10 reps - Shoulder External Rotation with Anchored Resistance  - 1 x daily - 7 x weekly - 3 sets - 10 reps - Scapular Retraction with Resistance  - 1 x daily - 7 x weekly - 3 sets - 10 reps - Standing Shoulder Internal Rotation with Anchored Resistance  - 1 x daily - 7 x weekly - 3 sets - 10 reps - Shoulder extension with resistance - Neutral  - 1 x daily - 7 x weekly - 3 sets - 10 reps  PATIENT EDUCATION: Education details: HEP, symptom management, strengthening in pain-free ranges. Person educated: Patient Education method:  Explanation, Demonstration, Tactile cues, Verbal cues, and Handouts Education comprehension: verbalized understanding, returned demonstration, verbal cues required, tactile cues required, and needs further education  HOME EXERCISE PROGRAM: Access Code: [email or web address] URL: [email or web address] Date: 07/14/2023 Prepared by: Lorayne Bender  Exercises - Theracane Over Shoulder  - 1 x daily - 7 x weekly - 3 sets - 10 reps - Shoulder External Rotation with Anchored Resistance  - 1 x daily - 7 x weekly - 3 sets - 10 reps - Scapular Retraction with Resistance  - 1 x daily - 7 x weekly - 3 sets - 10 reps - Standing Shoulder Internal Rotation with Anchored Resistance  - 1 x daily - 7 x weekly - 3 sets - 10 reps - Shoulder extension with resistance - Neutral  - 1 x daily - 7 x weekly - 3 sets - 10 reps  ASSESSMENT:  CLINICAL IMPRESSION: Patient is a  43 year old male with a long history of right shoulder pain.  He has increased pain with endrange movements.  He has full pain-free passive range of motion.  He presents with weakness in his right upper extremity compared to left.  Signs and symptoms are consistent with MD diagnosis of labral tear.  He is pending an MRI at this time.  He would benefit from skilled therapy to develop a program to increase shoulder strength and stability.   OBJECTIVE IMPAIRMENTS: decreased ROM, decreased strength, impaired UE functional use, and pain.   ACTIVITY LIMITATIONS: carrying, lifting, sleeping, and reach over head  PARTICIPATION LIMITATIONS: cleaning, shopping, community activity, and occupation  PERSONAL FACTORS: Time since onset of injury/illness/exacerbation and 1 comorbidity: Left knee instability  are also affecting patient's functional outcome.   REHAB POTENTIAL: Good  CLINICAL DECISION MAKING: stable   EVALUATION COMPLEXITY: low  GOALS: Goals reviewed with patient? Yes  SHORT TERM GOALS: Target date: 08/12/2023    Patient will increase active shoulder flexion to full without pain Baseline: Goal status: INITIAL  2.  Patient will increase gross right shoulder strength to 4+ out of 5 Baseline:  Goal status: INITIAL  3.  Patient will be independent with base exercise program Baseline:  Goal status: INITIAL  LONG TERM GOALS: Target date: 09/09/2023    Patient will return to full weightlifting program without increased pain Baseline:  Goal status: INITIAL  2.  Patient will perform ADLs without increased pain Baseline:  Goal status: INITIAL  3.  Patient will be independent with full program to prevent further exacerbation of right shoulder pain Baseline:  Goal status: INITIAL  PLAN: PT FREQUENCY: 1 time per week  PT DURATION: 6 weeks  PLANNED INTERVENTIONS: Therapeutic exercises, Therapeutic activity, Neuromuscular re-education, Patient/Family education, Self Care, Joint  mobilization, Aquatic Therapy, Dry Needling, Electrical stimulation, Cryotherapy, Moist heat, Ultrasound, and Manual therapy  PLAN FOR NEXT SESSION: Patient given base exercise program.  He is advised to go home or work on this for 2 weeks and come back.  Is unclear what is plan will be for his knee.  If it is a surgical intervention that we want to be judicious with his Medicaid visits.  Consider next visit adding supine ABC, ball versus ball, and advance bands as appropriate.  If URI are causing him pain consider isometrics.  Check all possible CPT codes: 54098 - PT Re-evaluation, 97110- Therapeutic Exercise, 97140 - Manual Therapy, 97530 - Therapeutic Activities, 97535 - Self Care, 97014 - Electrical stimulation (unattended), [id number] - Iontophoresis, [id number] - Ultrasound, and [id number] - Aquatic therapy    Check  all conditions that are expected to impact treatment: {Conditions expected to impact treatment:None of these apply   If treatment provided at initial evaluation, no treatment charged due to lack of authorization.       Dessie Coma, PT 07/14/2023, 3:59 PM

## 2023-08-03 ENCOUNTER — Ambulatory Visit (HOSPITAL_BASED_OUTPATIENT_CLINIC_OR_DEPARTMENT_OTHER): Payer: Medicaid Other | Admitting: Physical Therapy

## 2023-08-10 ENCOUNTER — Ambulatory Visit (HOSPITAL_BASED_OUTPATIENT_CLINIC_OR_DEPARTMENT_OTHER): Payer: Medicaid Other | Attending: Nurse Practitioner | Admitting: Physical Therapy

## 2023-08-10 DIAGNOSIS — M25511 Pain in right shoulder: Secondary | ICD-10-CM | POA: Diagnosis not present

## 2023-08-10 DIAGNOSIS — G8929 Other chronic pain: Secondary | ICD-10-CM | POA: Diagnosis not present

## 2023-08-10 NOTE — Therapy (Signed)
OUTPATIENT PHYSICAL THERAPY UPPER EXTREMITY EVALUATION   Patient Name: Jonathan Mccoy MRN: [id number] DOB:10/26/1981, 42 y.o., male Today's Date: 08/10/2023  END OF SESSION:  PT End of Session - 08/10/23 1021     Visit Number 2    Number of Visits 8    Date for PT Re-Evaluation 09/08/23    PT Start Time 1018    PT Stop Time 1100    PT Time Calculation (min) 42 min    Activity Tolerance Patient tolerated treatment well    Behavior During Therapy [facility] for tasks assessed/performed             No past medical history on file. No past surgical history on file. Patient Active Problem List   Diagnosis Date Noted   Encounter for annual physical exam 04/28/2023   Ganglion cyst of wrist, left 04/28/2023   Chronic right shoulder pain 04/28/2023   Crepitus of joint of left knee 04/28/2023   Chronic pain of left knee 04/28/2023   Tick bite of abdominal wall 04/28/2023   Precordial pain 04/28/2023   Intermittent palpitations 04/28/2023    PCP: Di Kindle Early NP  REFERRING PROVIDER: Huel Cote   REFERRING DIAG:  Diagnosis  M25.511,G89.29 (ICD-10-CM) - Chronic right shoulder pain    THERAPY DIAG:  No diagnosis found.  Rationale for Evaluation and Treatment: Rehabilitation  ONSET DATE: Several years ago  SUBJECTIVE:                                                                                                                                                                                      SUBJECTIVE STATEMENT: The patient reports he is doing a little better. He is not having pain today. He felt like it got tight for a day or two recently .   Eval:Patient has long history of right posterior shoulder pain.  He remembers several years ago bench pressing and feeling a pop.  Since that point he has pain with end range of motion in flexion and reaching behind his head back.  He also at times has pain in the front of the shoulder.  He has a history of left knee pain as  well.  He is pending an MRI of both his shoulder and his knee but has been delayed by insurance.  We will hold on his knee unless cleared by MD but will work on strengthening his shoulder at this time. Hand dominance: Right  PERTINENT HISTORY: Chronic pain of left knee  PAIN:  Are you having pain? Yes: NPRS scale: 5 out of 10/10 Pain location: Right shoulder Pain description: Aching Aggravating factors: Endrange movements Relieving factors: Not moving into  those ranges  PRECAUTIONS: None  RED FLAGS: None   WEIGHT BEARING RESTRICTIONS: No  FALLS:  Has patient fallen in last 6 months? No  LIVING ENVIRONMENT: Nothing pertinent OCCUPATION: Helps out with roofing at times    Hobbies:  Get back into the gym  PLOF: Independent  PATIENT GOALS: to have less pain    NEXT MD VISIT:   OBJECTIVE:   DIAGNOSTIC FINDINGS:  Waiting on MRI's   PATIENT SURVEYS :  FOTO    COGNITION: Overall cognitive status: Within functional limits for tasks assessed     SENSATION: WFL  POSTURE: Rounded shoulders forward head  UPPER EXTREMITY ROM:   Active ROM Right eval Left eval  Shoulder flexion 160 degrees with pain Full  Shoulder extension    Shoulder abduction    Shoulder adduction    Shoulder internal rotation Pulling in the front of the shoulder but equal motion to left side   Shoulder external rotation .  Pain when reaching behind head   Elbow flexion    Elbow extension    Wrist flexion    Wrist extension    Wrist ulnar deviation    Wrist radial deviation    Wrist pronation    Wrist supination    (Blank rows = not tested) Full passive right shoulder range of motion UPPER EXTREMITY MMT:  MMT Right eval Left eval  Shoulder flexion 4 5  Shoulder extension    Shoulder abduction    Shoulder adduction    Shoulder internal rotation 4+ 5  Shoulder external rotation 3+ 5  Middle trapezius    Lower trapezius    Elbow flexion    Elbow extension    Wrist flexion     Wrist extension    Wrist ulnar deviation    Wrist radial deviation    Wrist pronation    Wrist supination    Grip strength (lbs)    (Blank rows = not tested)     PALPATION:  Tenderness to palpation in upper trap and posterior shoulder around subscap area   TODAY'S TREATMENT:                                                                                                                                         DATE:  9/5  Pulley 2 min   Manual: trigger point release to upper trap, PROM into flexion and ER   Supine wand flexion x15 1lb x15 2lb   Supine ABC 2x 3 lbs   SL ER 2lbs 2x15   Cable:   Row 35 lbs 2x15  Triceps press down 35 lbs 2x15  Lat pull down 2x15 35 lb        Eval: Access Code: [email or web address] URL: [email or web address] Date: 07/14/2023 Prepared by: Lorayne Bender  Exercises - Theracane Over Shoulder  - 1 x daily - 7 x weekly - 3 sets - 10 reps - Shoulder External  Rotation with Anchored Resistance  - 1 x daily - 7 x weekly - 3 sets - 10 reps - Scapular Retraction with Resistance  - 1 x daily - 7 x weekly - 3 sets - 10 reps - Standing Shoulder Internal Rotation with Anchored Resistance  - 1 x daily - 7 x weekly - 3 sets - 10 reps - Shoulder extension with resistance - Neutral  - 1 x daily - 7 x weekly - 3 sets - 10 reps  PATIENT EDUCATION: Education details: HEP, symptom management, strengthening in pain-free ranges. Person educated: Patient Education method: Explanation, Demonstration, Tactile cues, Verbal cues, and Handouts Education comprehension: verbalized understanding, returned demonstration, verbal cues required, tactile cues required, and needs further education  HOME EXERCISE PROGRAM: Access Code: [email or web address] URL: [email or web address] Date: 07/14/2023 Prepared by: Lorayne Bender  Exercises - Theracane Over Shoulder  - 1 x daily - 7 x weekly - 3 sets - 10 reps - Shoulder External Rotation with Anchored Resistance   - 1 x daily - 7 x weekly - 3 sets - 10 reps - Scapular Retraction with Resistance  - 1 x daily - 7 x weekly - 3 sets - 10 reps - Standing Shoulder Internal Rotation with Anchored Resistance  - 1 x daily - 7 x weekly - 3 sets - 10 reps - Shoulder extension with resistance - Neutral  - 1 x daily - 7 x weekly - 3 sets - 10 reps  ASSESSMENT:  CLINICAL IMPRESSION: The patient tolerated treatment well. We were able to advance his exercises without a significant increase in pain. We also began light gym exercises. We updated his HEP. He was given sidelying ER and supine ABC for his home program. He had a trigger point in his upper trap. We worked on TP release to reduce the tightness.   Eval:Patient is a 42 year old male with a long history of right shoulder pain.  He has increased pain with endrange movements.  He has full pain-free passive range of motion.  He presents with weakness in his right upper extremity compared to left.  Signs and symptoms are consistent with MD diagnosis of labral tear.  He is pending an MRI at this time.  He would benefit from skilled therapy to develop a program to increase shoulder strength and stability.   OBJECTIVE IMPAIRMENTS: decreased ROM, decreased strength, impaired UE functional use, and pain.   ACTIVITY LIMITATIONS: carrying, lifting, sleeping, and reach over head  PARTICIPATION LIMITATIONS: cleaning, shopping, community activity, and occupation  PERSONAL FACTORS: Time since onset of injury/illness/exacerbation and 1 comorbidity: Left knee instability  are also affecting patient's functional outcome.   REHAB POTENTIAL: Good  CLINICAL DECISION MAKING: stable   EVALUATION COMPLEXITY: low  GOALS: Goals reviewed with patient? Yes  SHORT TERM GOALS: Target date: 08/12/2023    Patient will increase active shoulder flexion to full without pain Baseline: Goal status: INITIAL  2.  Patient will increase gross right shoulder strength to 4+ out of  5 Baseline:  Goal status: INITIAL  3.  Patient will be independent with base exercise program Baseline:  Goal status: INITIAL  LONG TERM GOALS: Target date: 09/09/2023    Patient will return to full weightlifting program without increased pain Baseline:  Goal status: INITIAL  2.  Patient will perform ADLs without increased pain Baseline:  Goal status: INITIAL  3.  Patient will be independent with full program to prevent further exacerbation of right shoulder pain Baseline:  Goal status: INITIAL  PLAN: PT FREQUENCY:  1 time per week  PT DURATION: 6 weeks  PLANNED INTERVENTIONS: Therapeutic exercises, Therapeutic activity, Neuromuscular re-education, Patient/Family education, Self Care, Joint mobilization, Aquatic Therapy, Dry Needling, Electrical stimulation, Cryotherapy, Moist heat, Ultrasound, and Manual therapy  PLAN FOR NEXT SESSION: Patient given base exercise program.  He is advised to go home or work on this for 2 weeks and come back.  Is unclear what is plan will be for his knee.  If it is a surgical intervention that we want to be judicious with his Medicaid visits.  Consider next visit adding supine ABC, ball versus ball, and advance bands as appropriate.  If URI are causing him pain consider isometrics.  Check all possible CPT codes: 63875 - PT Re-evaluation, 97110- Therapeutic Exercise, 97140 - Manual Therapy, 97530 - Therapeutic Activities, 97535 - Self Care, [phone] - Electrical stimulation (unattended), [phone] - Iontophoresis, [id number] - Ultrasound, and [id number] - Aquatic therapy    Check all conditions that are expected to impact treatment: {Conditions expected to impact treatment:None of these apply   If treatment provided at initial evaluation, no treatment charged due to lack of authorization.       Dessie Coma, PT 08/10/2023, 10:33 AM

## 2023-08-14 ENCOUNTER — Ambulatory Visit (HOSPITAL_BASED_OUTPATIENT_CLINIC_OR_DEPARTMENT_OTHER): Payer: Medicaid Other | Admitting: Physical Therapy

## 2023-08-14 ENCOUNTER — Encounter (HOSPITAL_BASED_OUTPATIENT_CLINIC_OR_DEPARTMENT_OTHER): Payer: Self-pay | Admitting: Physical Therapy

## 2023-08-14 DIAGNOSIS — G8929 Other chronic pain: Secondary | ICD-10-CM

## 2023-08-14 DIAGNOSIS — M25511 Pain in right shoulder: Secondary | ICD-10-CM | POA: Diagnosis not present

## 2023-08-14 NOTE — Therapy (Signed)
OUTPATIENT PHYSICAL THERAPY UPPER EXTREMITY EVALUATION   Patient Name: Jonathan Mccoy MRN: [id number] DOB:[birth year], 42 y.o., male Today's Date: 08/14/2023  END OF SESSION:  PT End of Session - 08/14/23 0952     Visit Number 3    Number of Visits 8    Date for PT Re-Evaluation 09/08/23    PT Start Time 0933    PT Stop Time 1012    PT Time Calculation (min) 39 min    Activity Tolerance Patient tolerated treatment well    Behavior During Therapy [facility] for tasks assessed/performed             History reviewed. No pertinent past medical history. History reviewed. No pertinent surgical history. Patient Active Problem List   Diagnosis Date Noted   Encounter for annual physical exam 04/28/2023   Ganglion cyst of wrist, left 04/28/2023   Chronic right shoulder pain 04/28/2023   Crepitus of joint of left knee 04/28/2023   Chronic pain of left knee 04/28/2023   Tick bite of abdominal wall 04/28/2023   Precordial pain 04/28/2023   Intermittent palpitations 04/28/2023    PCP: Di Kindle Early NP  REFERRING PROVIDER: Huel Cote   REFERRING DIAG:  Diagnosis  M25.511,G89.29 (ICD-10-CM) - Chronic right shoulder pain    THERAPY DIAG:  Chronic right shoulder pain  Rationale for Evaluation and Treatment: Rehabilitation  ONSET DATE: Several years ago  SUBJECTIVE:                                                                                                                                                                                      SUBJECTIVE STATEMENT: The patient did well after the last visit. He is having very little pain overall. He felt good with his gym exercises.   Eval:Patient has long history of right posterior shoulder pain.  He remembers several years ago bench pressing and feeling a pop.  Since that point he has pain with end range of motion in flexion and reaching behind his head back.  He also at times has pain in the front of the shoulder.  He has a  history of left knee pain as well.  He is pending an MRI of both his shoulder and his knee but has been delayed by insurance.  We will hold on his knee unless cleared by MD but will work on strengthening his shoulder at this time. Hand dominance: Right  PERTINENT HISTORY: Chronic pain of left knee  PAIN:  Are you having pain? Yes: NPRS scale: 5 out of 10/10 Pain location: Right shoulder Pain description: Aching Aggravating factors: Endrange movements Relieving factors: Not moving into those ranges  PRECAUTIONS:  None  RED FLAGS: None   WEIGHT BEARING RESTRICTIONS: No  FALLS:  Has patient fallen in last 6 months? No  LIVING ENVIRONMENT: Nothing pertinent OCCUPATION: Helps out with roofing at times    Hobbies:  Get back into the gym  PLOF: Independent  PATIENT GOALS: to have less pain    NEXT MD VISIT:   OBJECTIVE:   DIAGNOSTIC FINDINGS:  Waiting on MRI's   PATIENT SURVEYS :  FOTO    COGNITION: Overall cognitive status: Within functional limits for tasks assessed     SENSATION: WFL  POSTURE: Rounded shoulders forward head  UPPER EXTREMITY ROM:   Active ROM Right eval Left eval  Shoulder flexion 160 degrees with pain Full  Shoulder extension    Shoulder abduction    Shoulder adduction    Shoulder internal rotation Pulling in the front of the shoulder but equal motion to left side   Shoulder external rotation .  Pain when reaching behind head   Elbow flexion    Elbow extension    Wrist flexion    Wrist extension    Wrist ulnar deviation    Wrist radial deviation    Wrist pronation    Wrist supination    (Blank rows = not tested) Full passive right shoulder range of motion UPPER EXTREMITY MMT:  MMT Right eval Left eval  Shoulder flexion 4 5  Shoulder extension    Shoulder abduction    Shoulder adduction    Shoulder internal rotation 4+ 5  Shoulder external rotation 3+ 5  Middle trapezius    Lower trapezius    Elbow flexion    Elbow  extension    Wrist flexion    Wrist extension    Wrist ulnar deviation    Wrist radial deviation    Wrist pronation    Wrist supination    Grip strength (lbs)    (Blank rows = not tested)     PALPATION:  Tenderness to palpation in upper trap and posterior shoulder around subscap area   TODAY'S TREATMENT:                                                                                                                                         DATE:   9/9 UBE 2 fwd / 2 back   Manual: trigger point release to upper trap, PROM into flexion and ER   SL ER 2lbs 3x15   Supine ABC 2 lbs 2x   Arnold press 10 lbs 3x10   Incline bench DB 20 lbs 3x15  Curl 3x15 20 lbs      9/5  Pulley 2 min   Manual: trigger point release to upper trap, PROM into flexion and ER   Supine wand flexion x15 1lb x15 2lb   Supine ABC 2x 3 lbs   SL ER 2lbs 2x15   Cable:   Row 35 lbs 2x15  Triceps press down 35 lbs 2x15  Lat pull down 2x15 35 lb        Eval: Access Code: [email or web address] URL: [email or web address] Date: 07/14/2023 Prepared by: Lorayne Bender  Exercises - Theracane Over Shoulder  - 1 x daily - 7 x weekly - 3 sets - 10 reps - Shoulder External Rotation with Anchored Resistance  - 1 x daily - 7 x weekly - 3 sets - 10 reps - Scapular Retraction with Resistance  - 1 x daily - 7 x weekly - 3 sets - 10 reps - Standing Shoulder Internal Rotation with Anchored Resistance  - 1 x daily - 7 x weekly - 3 sets - 10 reps - Shoulder extension with resistance - Neutral  - 1 x daily - 7 x weekly - 3 sets - 10 reps  PATIENT EDUCATION: Education details: HEP, symptom management, strengthening in pain-free ranges. Person educated: Patient Education method: Explanation, Demonstration, Tactile cues, Verbal cues, and Handouts Education comprehension: verbalized understanding, returned demonstration, verbal cues required, tactile cues required, and needs further education  HOME  EXERCISE PROGRAM: Access Code: [email or web address] URL: [email or web address] Date: 07/14/2023 Prepared by: Lorayne Bender  Exercises - Theracane Over Shoulder  - 1 x daily - 7 x weekly - 3 sets - 10 reps - Shoulder External Rotation with Anchored Resistance  - 1 x daily - 7 x weekly - 3 sets - 10 reps - Scapular Retraction with Resistance  - 1 x daily - 7 x weekly - 3 sets - 10 reps - Standing Shoulder Internal Rotation with Anchored Resistance  - 1 x daily - 7 x weekly - 3 sets - 10 reps - Shoulder extension with resistance - Neutral  - 1 x daily - 7 x weekly - 3 sets - 10 reps  ASSESSMENT:  CLINICAL IMPRESSION: The patient tolerated treatment well. We continue to expand his gym exercises. He will join [employer] and see how it goes. He was advised to either schedule a visit in 2-3 weeks or go to the gym and see ow it goes. We have tried 6 gym exercises with hi, None have caused pain. he was advised. To start light and build up.  Eval:Patient is a 42 year old male with a long history of right shoulder pain.  He has increased pain with endrange movements.  He has full pain-free passive range of motion.  He presents with weakness in his right upper extremity compared to left.  Signs and symptoms are consistent with MD diagnosis of labral tear.  He is pending an MRI at this time.  He would benefit from skilled therapy to develop a program to increase shoulder strength and stability.   OBJECTIVE IMPAIRMENTS: decreased ROM, decreased strength, impaired UE functional use, and pain.   ACTIVITY LIMITATIONS: carrying, lifting, sleeping, and reach over head  PARTICIPATION LIMITATIONS: cleaning, shopping, community activity, and occupation  PERSONAL FACTORS: Time since onset of injury/illness/exacerbation and 1 comorbidity: Left knee instability  are also affecting patient's functional outcome.   REHAB POTENTIAL: Good  CLINICAL DECISION MAKING: stable   EVALUATION COMPLEXITY:  low  GOALS: Goals reviewed with patient? Yes  SHORT TERM GOALS: Target date: 08/12/2023    Patient will increase active shoulder flexion to full without pain Baseline: Goal status: INITIAL  2.  Patient will increase gross right shoulder strength to 4+ out of 5 Baseline:  Goal status: INITIAL  3.  Patient will be independent with base exercise program Baseline:  Goal status: INITIAL  LONG TERM GOALS: Target date:  09/09/2023    Patient will return to full weightlifting program without increased pain Baseline:  Goal status: INITIAL  2.  Patient will perform ADLs without increased pain Baseline:  Goal status: INITIAL  3.  Patient will be independent with full program to prevent further exacerbation of right shoulder pain Baseline:  Goal status: INITIAL  PLAN: PT FREQUENCY: 1 time per week  PT DURATION: 6 weeks  PLANNED INTERVENTIONS: Therapeutic exercises, Therapeutic activity, Neuromuscular re-education, Patient/Family education, Self Care, Joint mobilization, Aquatic Therapy, Dry Needling, Electrical stimulation, Cryotherapy, Moist heat, Ultrasound, and Manual therapy  PLAN FOR NEXT SESSION: Patient given base exercise program.  He is advised to go home or work on this for 2 weeks and come back.  Is unclear what is plan will be for his knee.  If it is a surgical intervention that we want to be judicious with his Medicaid visits.  Consider next visit adding supine ABC, ball versus ball, and advance bands as appropriate.  If URI are causing him pain consider isometrics.  Check all possible CPT codes: 08657 - PT Re-evaluation, 97110- Therapeutic Exercise, 97140 - Manual Therapy, 97530 - Therapeutic Activities, 97535 - Self Care, [phone] - Electrical stimulation (unattended), [phone] - Iontophoresis, [id number] - Ultrasound, and [id number] - Aquatic therapy    Check all conditions that are expected to impact treatment: {Conditions expected to impact treatment:None of these apply   If  treatment provided at initial evaluation, no treatment charged due to lack of authorization.       Dessie Coma, PT 08/14/2023, 9:54 AM

## 2024-08-08 ENCOUNTER — Encounter (HOSPITAL_COMMUNITY): Payer: Self-pay | Admitting: Emergency Medicine

## 2024-08-08 ENCOUNTER — Ambulatory Visit (HOSPITAL_COMMUNITY): Admission: EM | Admit: 2024-08-08 | Discharge: 2024-08-08 | Disposition: A | Discharge status: Home / Self Care

## 2024-08-08 DIAGNOSIS — B353 Tinea pedis: Secondary | ICD-10-CM

## 2024-08-08 DIAGNOSIS — B354 Tinea corporis: Secondary | ICD-10-CM

## 2024-08-08 MED ORDER — CLOTRIMAZOLE 1 % EX CREA: TOPICAL_CREAM | CUTANEOUS | 0 refills | Status: AC

## 2024-08-08 NOTE — ED Triage Notes (Signed)
 Pt reports Sunday started having rash on hands, arms, torso, legs and feet.  Started on foot first for about a month before rest of body broke out. Took benadryl and rubbed down couple times in alcohol and witch hazel. Denies ay new products, meds, foods etc.

## 2024-08-08 NOTE — Discharge Instructions (Signed)
 Starting topical medication applied once or twice daily over every lesion for the next 2 to 4 weeks.

## 2024-08-08 NOTE — ED Provider Notes (Signed)
 [facility]    CSN: [id number] Arrival date & time: 08/08/24  9043     History   Chief Complaint Chief Complaint  Patient presents with   Rash    HPI Jonathan Mccoy is a 43 y.o. male.   Patient has a pertinent past medical history of bilateral athlete's foot that has been present for over a month.  Since Sunday, he has noticed that a rash is started on his arms, and he has noticed that on his anterior leg as well as some on his stomach and side.  He states that it is somewhat itchy but not severe.  He denies any new exposures to medications, liquids, detergents or work exposures.  He denies any fevers, shortness of breath, weight loss or other changes.   Rash   History reviewed. No pertinent past medical history.  Patient Active Problem List   Diagnosis Date Noted   Encounter for annual physical exam 04/28/2023   Ganglion cyst of wrist, left 04/28/2023   Chronic right shoulder pain 04/28/2023   Crepitus of joint of left knee 04/28/2023   Chronic pain of left knee 04/28/2023   Tick bite of abdominal wall 04/28/2023   Precordial pain 04/28/2023   Intermittent palpitations 04/28/2023    History reviewed. No pertinent surgical history.     Home Medications    Prior to Admission medications   Medication Sig Start Date End Date Taking? Authorizing Provider  clotrimazole  (LOTRIMIN ) 1 % cream Apply to affected area 2 times daily 08/08/24  Yes Fuller Makin, Krystal HERO, DO  Ascorbic Acid Buffered (BUFFERED C POWDER PO) Take by mouth.    [provider]  doxycycline  (VIBRAMYCIN ) 100 MG capsule Take 1 capsule (100 mg total) by mouth 2 (two) times daily. 06/01/23   Arloa Suzen RAMAN, NP  ibuprofen  (ADVIL ,MOTRIN ) 600 MG tablet Take 1 tablet (600 mg total) by mouth every 6 (six) hours as needed. 11/25/17   Olympia Gee, PA-C  meloxicam  (MOBIC ) 15 MG tablet Take 1 tablet (15 mg total) by mouth daily. 04/11/23   Early, Sara E, NP  ondansetron  (ZOFRAN ) 4 MG tablet Take 1 tablet  (4 mg total) by mouth every 6 (six) hours. 01/27/16   Belvie Dempsey JAYSON, PA    Family History No family history on file.  Social History Social History   Tobacco Use   Smoking status: Never   Smokeless tobacco: Never  Substance Use Topics   Alcohol use: Yes   Drug use: Yes    Types: Marijuana     Allergies   Patient has no known allergies.   Review of Systems Review of Systems  Skin:  Positive for rash.  All other systems reviewed and are negative.    Physical Exam Triage Vital Signs ED Triage Vitals  Encounter Vitals Group     BP 08/08/24 1017 127/77     Girls Systolic BP Percentile --      Girls Diastolic BP Percentile --      Boys Systolic BP Percentile --      Boys Diastolic BP Percentile --      Pulse Rate 08/08/24 1017 (!) 59     Resp 08/08/24 1017 17     Temp 08/08/24 1017 97.8 F (36.6 C)     Temp Source 08/08/24 1017 Oral     SpO2 08/08/24 1017 98 %     Weight --      Height --      Head Circumference --  Peak Flow --      Pain Score 08/08/24 1016 0     Pain Loc --      Pain Education --      Exclude from Growth Chart --    No data found.  Updated Vital Signs BP 127/77 (BP Location: Right Arm)   Pulse (!) 59   Temp 97.8 F (36.6 C) (Oral)   Resp 17   SpO2 98%   Visual Acuity Right Eye Distance:   Left Eye Distance:   Bilateral Distance:    Right Eye Near:   Left Eye Near:    Bilateral Near:     Physical Exam Constitutional:      Appearance: Normal appearance.  HENT:     Head: Normocephalic and atraumatic.     Nose: Nose normal.  Eyes:     Extraocular Movements: Extraocular movements intact.     Pupils: Pupils are equal, round, and reactive to light.  Cardiovascular:     Rate and Rhythm: Normal rate.  Pulmonary:     Effort: Pulmonary effort is normal.  Skin:    General: Skin is warm and dry.     Comments: Patient has a scattered erythematous annular, scaly plaque with central clearing and advancing border.  This is  present on his anterior right thigh, left abdomen, bilateral dorsal arms.  Neurological:     Mental Status: He is alert.    UC Treatments / Results  Labs (all labs ordered are listed, but only abnormal results are displayed) Labs Reviewed - No data to display  EKG   Radiology No results found.  Procedures Procedures (including critical care time)  Medications Ordered in UC Medications - No data to display  Initial Impression / Assessment and Plan / UC Course  I have reviewed the triage vital signs and the nursing notes.  Pertinent labs & imaging results that were available during my care of the patient were reviewed by me and considered in my medical decision making (see chart for details).     - With patient's history of tinea pedis, as well as scratching this area repeatedly over the last month, it is likely that he has spreading tenia infection -The rash is consistent with tinea  - will start topical clotrimazole  twice daily for 2 to 4 weeks - Discussed that if there is a new rash, spreading redness, or changes that he should be evaluated -If he does not notice improvement in the next 2 weeks, invited him to return for possible oral treatment -At this time while he has the rash present on multiple areas of his body, it is not extensive and they are not enough lesions or I feel that oral medication would have been the first choice  Final Clinical Impressions(s) / UC Diagnoses   Final diagnoses:  Tinea corporis  Tinea pedis of both feet     Discharge Instructions      Starting topical medication applied once or twice daily over every lesion for the next 2 to 4 weeks.     ED Prescriptions     Medication Sig Dispense Auth. Provider   clotrimazole  (LOTRIMIN ) 1 % cream Apply to affected area 2 times daily 28 g Delanie Tirrell M, DO      PDMP not reviewed this encounter.   Jilda Krystal HERO, DO 08/08/24 1054
